# Patient Record
Sex: Male | Born: 1955 | Race: White | Hispanic: No | Marital: Married | Smoking: Never smoker
Health system: Southern US, Community
[De-identification: ages and names within clinical notes are randomized; demographics above are authoritative.]

## PROBLEM LIST (undated history)

## (undated) DIAGNOSIS — M1990 Unspecified osteoarthritis, unspecified site: Secondary | ICD-10-CM

## (undated) DIAGNOSIS — E7800 Pure hypercholesterolemia, unspecified: Secondary | ICD-10-CM

## (undated) DIAGNOSIS — N529 Male erectile dysfunction, unspecified: Secondary | ICD-10-CM

## (undated) DIAGNOSIS — I10 Essential (primary) hypertension: Secondary | ICD-10-CM

## (undated) DIAGNOSIS — G479 Sleep disorder, unspecified: Secondary | ICD-10-CM

## (undated) DIAGNOSIS — L409 Psoriasis, unspecified: Secondary | ICD-10-CM

## (undated) DIAGNOSIS — D869 Sarcoidosis, unspecified: Secondary | ICD-10-CM

## (undated) DIAGNOSIS — G4730 Sleep apnea, unspecified: Secondary | ICD-10-CM

## (undated) DIAGNOSIS — M109 Gout, unspecified: Secondary | ICD-10-CM

## (undated) HISTORY — PX: COLONOSCOPY: SHX5424

## (undated) HISTORY — PX: EYE SURGERY: SHX253

## (undated) HISTORY — PX: TONSILLECTOMY: SUR1361

---

## 2008-01-09 ENCOUNTER — Ambulatory Visit: Payer: Self-pay | Admitting: Gastroenterology

## 2008-01-28 ENCOUNTER — Ambulatory Visit: Payer: Self-pay | Admitting: Gastroenterology

## 2009-09-12 ENCOUNTER — Emergency Department: Payer: Self-pay | Admitting: Unknown Physician Specialty

## 2011-03-31 ENCOUNTER — Ambulatory Visit: Payer: Self-pay | Admitting: Family Medicine

## 2011-09-09 ENCOUNTER — Ambulatory Visit: Payer: Self-pay | Admitting: Gastroenterology

## 2011-09-13 LAB — PATHOLOGY REPORT

## 2012-03-22 ENCOUNTER — Observation Stay: Payer: Self-pay | Admitting: Family Medicine

## 2012-03-22 LAB — COMPREHENSIVE METABOLIC PANEL
Alkaline Phosphatase: 92 U/L (ref 50–136)
BUN: 15 mg/dL (ref 7–18)
Bilirubin,Total: 0.4 mg/dL (ref 0.2–1.0)
Calcium, Total: 9.1 mg/dL (ref 8.5–10.1)
Chloride: 107 mmol/L (ref 98–107)
Co2: 26 mmol/L (ref 21–32)
Creatinine: 0.97 mg/dL (ref 0.60–1.30)
EGFR (Non-African Amer.): >60
Glucose: 100 mg/dL — ABNORMAL HIGH (ref 65–99)
Osmolality: 280 (ref 275–301)
Potassium: 4.2 mmol/L (ref 3.5–5.1)
Sodium: 140 mmol/L (ref 136–145)
Total Protein: 7.9 g/dL (ref 6.4–8.2)

## 2012-03-22 LAB — CBC
HCT: 43.6 % (ref 40.0–52.0)
HGB: 13.9 g/dL (ref 13.0–18.0)
MCH: 26.6 pg (ref 26.0–34.0)
MCHC: 31.9 g/dL — ABNORMAL LOW (ref 32.0–36.0)
MCV: 84 fL (ref 80–100)
Platelet: 309 10*3/uL (ref 150–440)
RBC: 5.23 10*6/uL (ref 4.40–5.90)
RDW: 13.7 % (ref 11.5–14.5)
WBC: 11.1 10*3/uL — ABNORMAL HIGH (ref 3.8–10.6)

## 2012-03-22 LAB — URINALYSIS, COMPLETE
Bacteria: NONE SEEN
Bilirubin,UR: NEGATIVE
Blood: NEGATIVE
Leukocyte Esterase: NEGATIVE
Ph: 5 (ref 4.5–8.0)
Specific Gravity: 1.021 (ref 1.003–1.030)
Squamous Epithelial: NONE SEEN
WBC UR: <1 /HPF (ref 0–5)

## 2012-03-23 LAB — LIPID PANEL
HDL Cholesterol: 46 mg/dL (ref 40–60)
Triglycerides: 119 mg/dL (ref 0–200)
VLDL Cholesterol, Calc: 24 mg/dL (ref 5–40)

## 2013-09-04 ENCOUNTER — Ambulatory Visit: Payer: Self-pay | Admitting: Neurology

## 2013-09-18 ENCOUNTER — Ambulatory Visit: Payer: Self-pay | Admitting: Neurology

## 2013-11-10 ENCOUNTER — Ambulatory Visit: Payer: Self-pay | Admitting: Physician Assistant

## 2013-11-10 LAB — URINALYSIS, COMPLETE
Bacteria: NEGATIVE
Blood: NEGATIVE
Glucose,UR: NEGATIVE
Leukocyte Esterase: NEGATIVE
NITRITE: NEGATIVE
Ph: 5.5 (ref 5.0–8.0)
RBC,UR: NONE SEEN /HPF (ref 0–5)
Specific Gravity: 1.025 (ref 1.000–1.030)
Squamous Epithelial: NONE SEEN

## 2014-05-23 NOTE — Discharge Summary (Signed)
PATIENT NAME:  Bryce Jenkins, Bryce Jenkins MR#:  [id number] DATE OF BIRTH:  1955/04/05  DATE OF ADMISSION:  03/22/2012 DATE OF DISCHARGE:  03/23/2012  DISCHARGE DIAGNOSES: 1.  Bell palsy of the right side of the face.  2.  Hyperlipidemia.  It is diet-controlled.  3.  Allergic rhinitis.   DISCHARGE MEDICATIONS:   1.  Prednisone 10 mg tabs 6 tabs by mouth x 5 days, then decrease by 10 mg every two days.  2.  Valacyclovir 1 gram 1 tab by mouth 3 times daily.  3.  Flonase as directed.   PERTINENT LABORATORY AND STUDIES:  The patient had a CT scan that was negative.  The patient also had MRI of the brain that was negative.   PROCEDURES:  None.   CONSULTATIONS:  None.   BRIEF HOSPITAL COURSE:  1.  Bell palsy.  The patient initially came in with right side facial numbness and weakness.  On exam was noted to be consistent with Bell palsy.  A CT and MRI of the brain were both negative.  He is to be treated with seven days of valacyclovir 1 gram 3 times a day.  Also, be treated with a prednisone taper.  We will need to cover the eye to protect the eye if unable to blink and also use eye moisturizing drops as needed.  Will follow up with Dr. Netty Starring within 10 days.  2.  Other chronic medical issues remained stable.   He is to be discharged home.     ____________________________ Dion Body, MD kl:ea D: 03/23/2012 16:34:47 ET T: 03/24/2012 06:44:08 ET JOB#: [id number]  cc: Dion Body, MD, <Dictator> Dr. Burtis Junes MD ELECTRONICALLY SIGNED 03/27/2012 14:21

## 2014-05-23 NOTE — H&P (Signed)
PATIENT NAME:  Bryce Jenkins, ORCUTT MR#:  [id number] DATE OF BIRTH:  1955-11-23  DATE OF ADMISSION:  03/22/2012  PRIMARY CARE PHYSICIAN:  Dr. Netty Starring REFERRING PHYSICIAN:  Dr. Reita Cliche.  CHIEF COMPLAINT: Right facial numbness and tingling today.   HISTORY OF PRESENT ILLNESS: The patient is a 59 year old Caucasian male with a history of sarcoidosis diagnosis in 1998, who presented to the ED with right facial numbness, tingling, today. The patient also has right arm numbness and tingling, unable to close right eye and unable to move tongue. The patient denies any headache or dizziness. No slurred speech, or dysphagia or choking. The patient denies any incontinence, loss of consciousness or seizure. The patient said he got a cold last Saturday. He denies any other weakness.   PAST MEDICAL HISTORY: Sarcoidosis.   PAST SURGICAL HISTORY: None.   SOCIAL HISTORY: No smoking, alcohol drinking or illicit drugs.   FAMILY HISTORY: Mother had skull aneurysm. Father had 2 abdominal aneurysms, cousin had brain aneurysm, both parents had hypertension.   ALLERGIES: None.  MEDICATIONS:  Flonase 50 mcg inhalation 1 spray once a day.   REVIEW OF SYSTEMS:   CONSTITUTIONAL: The patient denies any fever or chills. No headache or dizziness. No weakness.   EYES: No double vision, blurry vision,  but unable to close right eye.  ENT: No postnasal drip, slurred speech or dysphagia. No epistaxis.   CARDIOVASCULAR: No chest pain, palpitation, orthopnea or nocturnal dyspnea. No leg edema.  PULMONARY: No cough, sputum, shortness of breath or hemoptysis.  GASTROINTESTINAL: No abdominal pain, nausea, vomiting, or diarrhea. No melena or bloody stool.  GENITOURINARY: No dysuria, hematuria or incontinence.  SKIN: No rash or jaundice.  NEUROLOGY: No syncope, loss of consciousness or seizure, but has right-sided facial droop and numbness, tingling,  ENDOCRINE: No polyuria, polydipsia or heat or cold intolerance.  HEMATOLOGY: No  easy bruising, bleeding.   PHYSICAL EXAMINATION: VITAL SIGNS: Temperature 98.9, blood pressure 155/81 initially 185/85 pulse 93, respirations 20, oxygen saturation 99% on room air.  GENERAL: The patient is alert, awake, oriented, in no acute distress.  HEENT: Pupils round, equal, reactive to light and accommodation. Moist oral mucosa. Clear oropharynx. Has right-sided facial droop, unable to close right eye.  NECK: Supple. No JVD or carotid bruits. No lymphadenopathy. No thyromegaly.  CARDIOVASCULAR: S1, S2 regular rate and rhythm. No murmurs, gallops.  PULMONARY: Bilateral air entry. No wheezing or rales. No use of accessory muscles to breathe.  ABDOMEN: Soft. No distention, no tenderness and no organomegaly. Bowel sounds present.  EXTREMITIES: No edema, clubbing or cyanosis. No calf tenderness. Strong bilateral pedal pulses.  SKIN: No rash or jaundice.  NEUROLOGY: A and O x 3. The patient has right facial droop, but no other focal deficit. Power 5/5. Sensation intact.   LABORATORY DATA: Glucose 100, BUN 15, creatinine 0.97. Electrolytes are normal. WBC 11.1, hemoglobin 13.9 and platelets 309.   EKG showed normal sinus rhythm at 96 beats per minute with possible left atrial enlargement with incomplete right bundle branch block.   IMPRESSION:   1.  Possible Bell's palsy, but need to rule out acute cerebrovascular accident.  2.  History of sarcoidosis.   PLAN OF TREATMENT: 1. The patient will be placed for observation. The patient has high possibility of Bell palsy. We will start valacyclovir and prednisone, but the patient needs to rule out acute cerebrovascular accident and also need to rule out brain aneurysm due to family history of  aneurysm in both parents and cousin.  CAT scan of head is negative. We will get an MRI of the brain, check lipid panel, we will start aspirin  and Zocor.  2.  Gastrointestinal and deep vein thrombosis prophylaxis.  3.  I discussed the patient and situation  and plan of treatment with the patient.   TIME SPENT: About 55 minutes    ____________________________ Demetrios Loll, MD qc:cc D: 03/22/2012 21:21:00 ET T: 03/22/2012 21:56:48 ET JOB#: [id number]  cc: Demetrios Loll, MD, <Dictator> Demetrios Loll MD ELECTRONICALLY SIGNED 03/23/2012 14:36

## 2015-09-24 ENCOUNTER — Ambulatory Visit: Payer: BLUE CROSS/BLUE SHIELD | Attending: Neurology

## 2015-09-24 DIAGNOSIS — G4733 Obstructive sleep apnea (adult) (pediatric): Secondary | ICD-10-CM | POA: Insufficient documentation

## 2015-09-24 DIAGNOSIS — R0683 Snoring: Secondary | ICD-10-CM | POA: Diagnosis present

## 2015-09-24 DIAGNOSIS — G4761 Periodic limb movement disorder: Secondary | ICD-10-CM | POA: Insufficient documentation

## 2016-11-22 ENCOUNTER — Ambulatory Visit
Admission: RE | Admit: 2016-11-22 | Discharge: 2016-11-22 | Disposition: A | Discharge status: Home / Self Care | Payer: BLUE CROSS/BLUE SHIELD | Source: Ambulatory Visit | Attending: Family Medicine | Admitting: Family Medicine

## 2016-11-22 ENCOUNTER — Other Ambulatory Visit: Payer: Self-pay | Admitting: Family Medicine

## 2016-11-22 DIAGNOSIS — M79661 Pain in right lower leg: Secondary | ICD-10-CM

## 2018-01-06 ENCOUNTER — Ambulatory Visit (INDEPENDENT_AMBULATORY_CARE_PROVIDER_SITE_OTHER): Payer: BLUE CROSS/BLUE SHIELD

## 2018-01-06 ENCOUNTER — Ambulatory Visit
Admission: EM | Admit: 2018-01-06 | Discharge: 2018-01-06 | Disposition: A | Discharge status: Home / Self Care | Payer: BLUE CROSS/BLUE SHIELD

## 2018-01-06 ENCOUNTER — Other Ambulatory Visit: Payer: Self-pay

## 2018-01-06 ENCOUNTER — Encounter: Payer: Self-pay | Admitting: Gynecology

## 2018-01-06 DIAGNOSIS — Z862 Personal history of diseases of the blood and blood-forming organs and certain disorders involving the immune mechanism: Secondary | ICD-10-CM

## 2018-01-06 DIAGNOSIS — R062 Wheezing: Secondary | ICD-10-CM | POA: Diagnosis not present

## 2018-01-06 DIAGNOSIS — Z8709 Personal history of other diseases of the respiratory system: Secondary | ICD-10-CM

## 2018-01-06 DIAGNOSIS — Z760 Encounter for issue of repeat prescription: Secondary | ICD-10-CM

## 2018-01-06 DIAGNOSIS — J101 Influenza due to other identified influenza virus with other respiratory manifestations: Secondary | ICD-10-CM

## 2018-01-06 HISTORY — DX: Sleep disorder, unspecified: G47.9

## 2018-01-06 HISTORY — DX: Psoriasis, unspecified: L40.9

## 2018-01-06 HISTORY — DX: Pure hypercholesterolemia, unspecified: E78.00

## 2018-01-06 HISTORY — DX: Gout, unspecified: M10.9

## 2018-01-06 HISTORY — DX: Essential (primary) hypertension: I10

## 2018-01-06 HISTORY — DX: Male erectile dysfunction, unspecified: N52.9

## 2018-01-06 LAB — RAPID INFLUENZA A&B ANTIGENS
Influenza A (ARMC): POSITIVE — AB
Influenza B (ARMC): NEGATIVE

## 2018-01-06 MED ORDER — OSELTAMIVIR PHOSPHATE 75 MG PO CAPS: 75.0000 mg | ORAL_CAPSULE | Freq: Two times a day (BID) | ORAL | 0 refills | Status: AC

## 2018-01-06 MED ORDER — PREDNISONE 10 MG PO TABS: ORAL_TABLET | ORAL | 0 refills | Status: AC

## 2018-01-06 MED ORDER — ALBUTEROL SULFATE HFA 108 (90 BASE) MCG/ACT IN AERS: 2.0000 | INHALATION_SPRAY | RESPIRATORY_TRACT | 0 refills | Status: AC | PRN

## 2018-01-06 MED ORDER — IPRATROPIUM-ALBUTEROL 0.5-2.5 (3) MG/3ML IN SOLN
3.0000 mL | Freq: Once | RESPIRATORY_TRACT | Status: AC
  Administered 2018-01-06: 3 mL via RESPIRATORY_TRACT

## 2018-01-06 MED ORDER — HYDROCOD POLST-CPM POLST ER 10-8 MG/5ML PO SUER: 5.0000 mL | Freq: Every evening | ORAL | 0 refills | Status: AC | PRN

## 2018-01-06 NOTE — Discharge Instructions (Signed)
Take medication as prescribed. Rest. Drink plenty of fluids. Over the counter tylenol.  Follow up with your primary care physician this week as needed. Return to Urgent care for new or worsening concerns.

## 2018-01-06 NOTE — ED Provider Notes (Addendum)
[facility] ____________________________________________  Time seen: Approximately 10:30 AM  I have reviewed the triage vital signs and the nursing notes.   HISTORY  Chief Complaint No chief complaint on file.   HPI Bryce Jenkins is a 62 y.o. male with past medical history of sarcoidosis, hypertension presenting for evaluation of 2 days of cough, congestion and drainage complaints.  States on Thursday evening he started having runny nose, nasal congestion and some cough.  States yesterday his symptoms increase with overall feeling worse, accompanying headaches, feeling tired and increased cough.  States yesterday evening and last night he started to intermittently hear himself wheeze with some chest tightness and shortness of breath.  States shortness of breath only present with wheezing.  States he is out of his albuterol inhaler and needs a refill.  No accompanying chest pain.  Denies known fevers.  States cough has disrupted sleep.  Mostly dry cough.  Has taken some over-the-counter congestion medication, but no over-the-counter medications taken today.  Is overall continued to eat and drink well.  Denies abdominal pain or GI changes.  Denies known direct sick contacts.  Denies renal insufficiency.  Patient reports he was diagnosed with sarcoidosis in the 1990s and had a last relapse in early 2000's, and states has since had no issues.  PCP: Richarda Overlie   Past Medical History:  Diagnosis Date  . ED (erectile dysfunction)   . Gout   . Hypercholesteremia   . Hypertension   . Psoriasis   . Sleep disorder     There are no active problems to display for this patient.   Past Surgical History:  Procedure Laterality Date  . COLONOSCOPY    . EYE SURGERY    . TONSILLECTOMY       No current facility-administered medications for this encounter.   Current Outpatient Medications:  .  albuterol (VENTOLIN HFA) 108 (90 Base) MCG/ACT inhaler, INHALE 2 PUFFS INTO LUNGS  EVERY 4 HOURS AS NEEDED FOR WHEEZING., Disp: , Rfl:  .  aspirin EC 81 MG tablet, Take by mouth., Disp: , Rfl:  .  Lifitegrast 5 % SOLN, Apply to eye., Disp: , Rfl:  .  losartan (COZAAR) 25 MG tablet, Take by mouth., Disp: , Rfl:  .  Omega 3-6-9 CAPS, Take by mouth., Disp: , Rfl:  .  sodium fluoride (PREVIDENT) 1.1 % GEL dental gel, , Disp: , Rfl:  .  triamcinolone cream (KENALOG) 0.1 %, , Disp: , Rfl:  .  albuterol (PROVENTIL HFA;VENTOLIN HFA) 108 (90 Base) MCG/ACT inhaler, Inhale 2 puffs into the lungs every 4 (four) hours as needed., Disp: 1 Inhaler, Rfl: 0 .  Calcium Carbonate-Vitamin D 600-400 MG-UNIT tablet, Take by mouth., Disp: , Rfl:  .  chlorpheniramine-HYDROcodone (TUSSIONEX PENNKINETIC ER) 10-8 MG/5ML SUER, Take 5 mLs by mouth at bedtime as needed for cough. do not drive or operate machinery while taking as can cause drowsiness., Disp: 50 mL, Rfl: 0 .  oseltamivir (TAMIFLU) 75 MG capsule, Take 1 capsule (75 mg total) by mouth every 12 (twelve) hours., Disp: 10 capsule, Rfl: 0 .  predniSONE (DELTASONE) 10 MG tablet, Start 60 mg po day one, then 50 mg po day two, taper by 10 mg daily until complete., Disp: 21 tablet, Rfl: 0  Allergies Allopurinol  Family History  Problem Relation Age of Onset  . Lung cancer Mother   . Breast cancer Mother   . Anuerysm Mother     Social History Social History   Tobacco Use  .  Smoking status: Never Smoker  . Smokeless tobacco: Never Used  Substance Use Topics  . Alcohol use: Yes  . Drug use: Never    Review of Systems Constitutional: Some body aches.  Denies known fevers. ENT: States occasional sore throat from coughing only.  Denies other sore throat. Cardiovascular: Denies chest pain. Respiratory: Positive shortness of breath. Gastrointestinal: No abdominal pain.   Musculoskeletal: Negative for back pain.  Denies atypical extremity swelling. Skin: Negative for rash. Neurological: Negative for headaches, focal weakness or  numbness.    ____________________________________________   PHYSICAL EXAM:  VITAL SIGNS: ED Triage Vitals  Enc Vitals Group     BP 01/06/18 0916 (!) 141/69     Pulse Rate 01/06/18 0914 (!) 108     Resp 01/06/18 0914 18     Temp 01/06/18 0914 99 F (37.2 C)     Temp Source 01/06/18 0914 Oral     SpO2 01/06/18 0914 98 %     Weight 01/06/18 0917 268 lb (121.6 kg)     Height 01/06/18 0917 6\' 3"  (1.905 m)     Head Circumference --      Peak Flow --      Pain Score 01/06/18 0916 2     Pain Loc --      Pain Edu? --      Excl. in [place]? --     Constitutional: Alert and oriented. Well appearing and in no acute distress. Eyes: Conjunctivae are normal.  Head: Atraumatic. No sinus tenderness to palpation. No swelling. No erythema.  Ears: no erythema, normal TMs bilaterally.   Nose:Nasal congestion with clear rhinorrhea  Mouth/Throat: Mucous membranes are moist. No pharyngeal erythema. No tonsillar swelling or exudate.  Neck: No stridor.  No cervical spine tenderness to palpation. Hematological/Lymphatic/Immunilogical: No cervical lymphadenopathy. Cardiovascular:  regular rhythm. Grossly normal heart sounds.  Good peripheral circulation. Respiratory: Normal respiratory effort.  No retractions.  Mild scattered inspiratory and expiratory wheezes.  No rhonchi.  Occasional dry cough noted with bronchospasm.  Speaks in complete sentences.  Good air movement.  Musculoskeletal: Ambulatory with steady gait. No cervical, thoracic or lumbar tenderness to palpation. Neurologic:  Normal speech and language. No gait instability. Skin:  Skin appears warm, dry and intact. No rash noted. Psychiatric: Mood and affect are normal. Speech and behavior are normal.  ___________________________________________   LABS (all labs ordered are listed, but only abnormal results are displayed)  Labs Reviewed  RAPID INFLUENZA A&B ANTIGENS ([facility] ONLY) - Abnormal; Notable for the following components:      Result  Value   Influenza A ([facility]) POSITIVE (*)    All other components within normal limits   ____________________________________________  RADIOLOGY  Dg Chest 2 View  Result Date: 01/06/2018 CLINICAL DATA:  Cough over the last 3 days. EXAM: CHEST - 2 VIEW COMPARISON:  None. FINDINGS: Heart and mediastinal shadows are normal. There is mild central bronchial thickening but no infiltrate, collapse or effusion. No significant bone finding. Vascularity is normal. IMPRESSION: Bronchitis pattern. No consolidation or collapse. Electronically Signed   By: Nelson Chimes M.D.   On: 01/06/2018 10:14   ____________________________________________   PROCEDURES Procedures    INITIAL IMPRESSION / ASSESSMENT AND PLAN / ED COURSE  Pertinent labs & imaging results that were available during my care of the patient were reviewed by me and considered in my medical decision making (see chart for details).  Overall well-appearing patient.  No acute distress.  Patient with quick onset of symptoms.  Suspect influenza.  As with current wheezing and history of sarcoidosis will evaluate chest x-ray.  Chest x-ray as above per radiologist, bronchitis pattern, no consolidation or collapse.  Influenza A positive.  Discussed treatment with patient.  Will treat with prednisone taper, refill albuterol inhaler, Tamiflu and PRN Tussionex.  Encourage rest, fluids, supportive care.  Discussed very strict follow-up and return parameters.Discussed indication, risks and benefits of medications with patient.  Discussed follow up with Primary care physician this week. Discussed follow up and return parameters including no resolution or any worsening concerns. Patient verbalized understanding and agreed to plan.   ____________________________________________   FINAL CLINICAL IMPRESSION(S) / ED DIAGNOSES  Final diagnoses:  Influenza A  Wheezing  History of sarcoidosis     ED Discharge Orders         Ordered    oseltamivir  (TAMIFLU) 75 MG capsule  Every 12 hours     01/06/18 1032    predniSONE (DELTASONE) 10 MG tablet     01/06/18 1032    albuterol (PROVENTIL HFA;VENTOLIN HFA) 108 (90 Base) MCG/ACT inhaler  Every 4 hours PRN     01/06/18 1032    chlorpheniramine-HYDROcodone (TUSSIONEX PENNKINETIC ER) 10-8 MG/5ML SUER  At bedtime PRN     01/06/18 1032           Note: This dictation was prepared with Dragon dictation along with smaller phrase technology. Any transcriptional errors that result from this process are unintentional.        Marylene Land, NP 01/06/18 1226

## 2018-01-06 NOTE — ED Triage Notes (Addendum)
Patient c/o Patient c/o cold virus/ painful  In chest when coughing/ chest congestion and headache.

## 2019-06-05 IMAGING — CR DG CHEST 2V
2 series · 2 of 2 positions shown · non-contrast
Comparison: None.

CLINICAL DATA: Cough over the last 3 days.

EXAM:
CHEST - 2 VIEW

[chest pa]
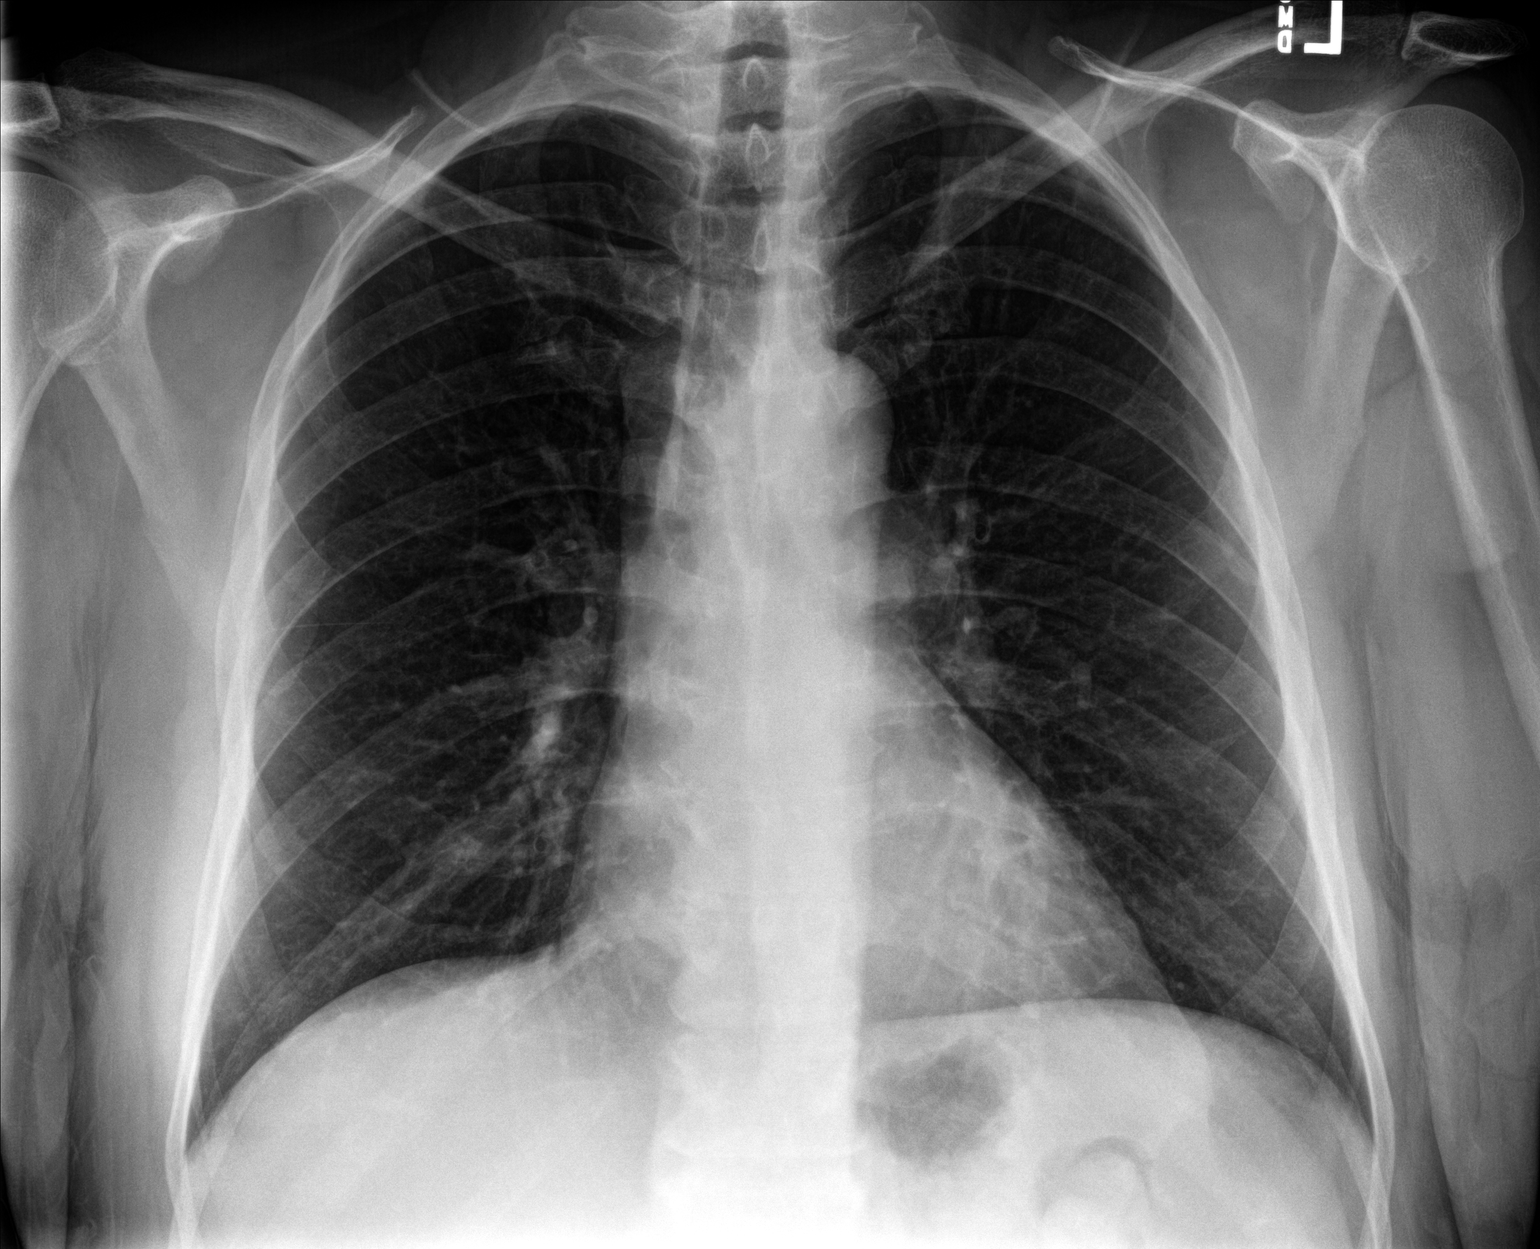

[chest lat]
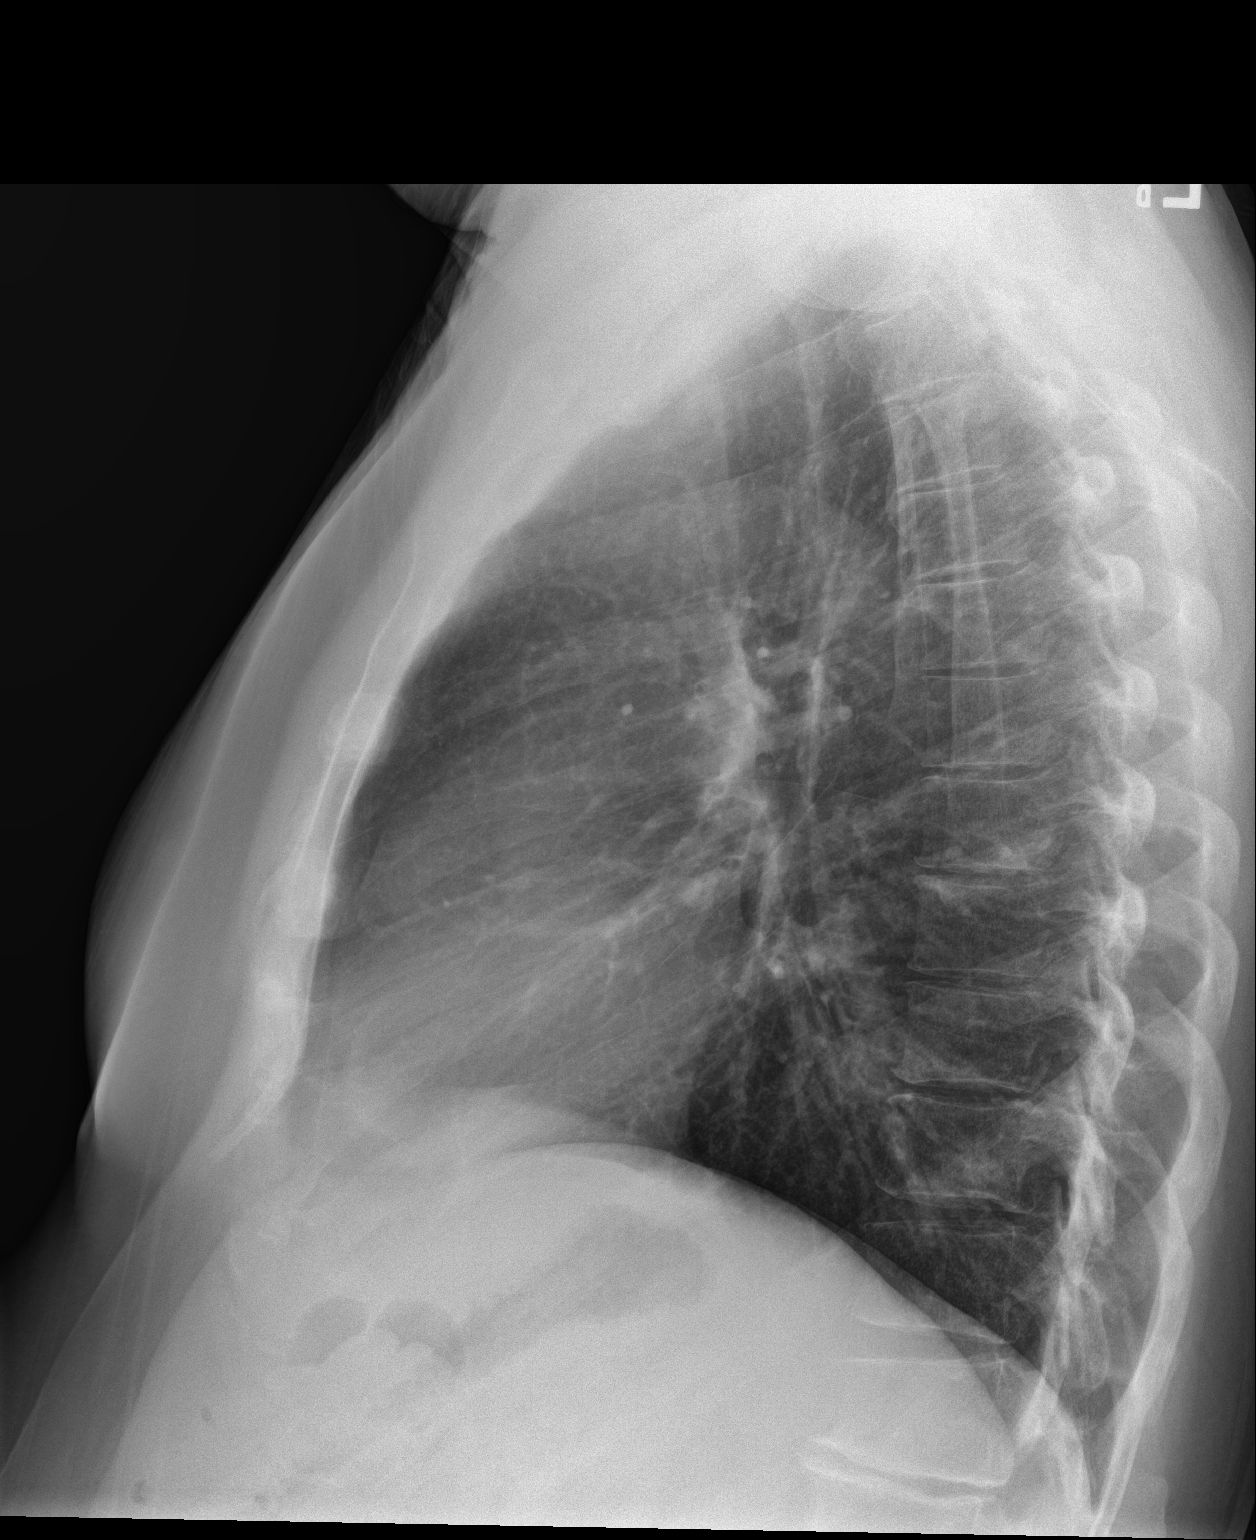

[2 of 2 positions shown; findings below may reference images not displayed]

FINDINGS: Heart and mediastinal shadows are normal. There is mild central
bronchial thickening but no infiltrate, collapse or effusion. No
significant bone finding. Vascularity is normal.
IMPRESSION: Bronchitis pattern. No consolidation or collapse.

## 2019-06-06 ENCOUNTER — Ambulatory Visit: Payer: BLUE CROSS/BLUE SHIELD | Attending: Internal Medicine

## 2019-06-06 DIAGNOSIS — Z23 Encounter for immunization: Secondary | ICD-10-CM

## 2019-06-06 NOTE — Progress Notes (Signed)
   Covid-19 Vaccination Clinic  Name:  Gaelen Rayas    MRN: [id number] DOB: Mar 04, 1955  06/06/2019  Mr. Apa was observed post Covid-19 immunization for 15 minutes without incident. He was provided with Vaccine Information Sheet and instruction to access the V-Safe system.   Mr. Kribs was instructed to call 911 with any severe reactions post vaccine: Marland Kitchen Difficulty breathing  . Swelling of face and throat  . A fast heartbeat  . A bad rash all over body  . Dizziness and weakness   Immunizations Administered    Name Date Dose VIS Date Route   Moderna COVID-19 Vaccine 06/06/2019  5:58 PM 0.5 mL 01/2019 Intramuscular   Manufacturer: Levan Hurst   Lot: [id number]   NDC: [id number]

## 2019-07-04 ENCOUNTER — Ambulatory Visit: Payer: BLUE CROSS/BLUE SHIELD

## 2019-07-11 ENCOUNTER — Other Ambulatory Visit: Payer: Self-pay

## 2019-07-11 ENCOUNTER — Other Ambulatory Visit
Admission: RE | Admit: 2019-07-11 | Discharge: 2019-07-11 | Disposition: A | Discharge status: Home / Self Care | Payer: BC Managed Care – PPO | Source: Ambulatory Visit | Attending: Internal Medicine | Admitting: Internal Medicine

## 2019-07-11 DIAGNOSIS — Z01812 Encounter for preprocedural laboratory examination: Secondary | ICD-10-CM | POA: Diagnosis not present

## 2019-07-11 DIAGNOSIS — Z20822 Contact with and (suspected) exposure to covid-19: Secondary | ICD-10-CM | POA: Diagnosis not present

## 2019-07-12 ENCOUNTER — Encounter: Payer: Self-pay | Admitting: Internal Medicine

## 2019-07-12 LAB — SARS CORONAVIRUS 2 (TAT 6-24 HRS): SARS Coronavirus 2: NEGATIVE

## 2019-07-15 ENCOUNTER — Ambulatory Visit: Payer: BC Managed Care – PPO | Admitting: Anesthesiology

## 2019-07-15 ENCOUNTER — Ambulatory Visit
Admission: RE | Admit: 2019-07-15 | Discharge: 2019-07-15 | Disposition: A | Discharge status: Home / Self Care | Payer: BC Managed Care – PPO | Attending: Internal Medicine | Admitting: Internal Medicine

## 2019-07-15 ENCOUNTER — Encounter: Payer: Self-pay | Admitting: Internal Medicine

## 2019-07-15 ENCOUNTER — Other Ambulatory Visit: Payer: Self-pay

## 2019-07-15 ENCOUNTER — Encounter
Admission: RE | Disposition: A | Discharge status: Home / Self Care | Payer: Self-pay | Source: Home / Self Care | Attending: Internal Medicine

## 2019-07-15 DIAGNOSIS — Z7952 Long term (current) use of systemic steroids: Secondary | ICD-10-CM | POA: Insufficient documentation

## 2019-07-15 DIAGNOSIS — D869 Sarcoidosis, unspecified: Secondary | ICD-10-CM | POA: Diagnosis not present

## 2019-07-15 DIAGNOSIS — I10 Essential (primary) hypertension: Secondary | ICD-10-CM | POA: Diagnosis not present

## 2019-07-15 DIAGNOSIS — Z7982 Long term (current) use of aspirin: Secondary | ICD-10-CM | POA: Diagnosis not present

## 2019-07-15 DIAGNOSIS — L409 Psoriasis, unspecified: Secondary | ICD-10-CM | POA: Diagnosis not present

## 2019-07-15 DIAGNOSIS — Z888 Allergy status to other drugs, medicaments and biological substances status: Secondary | ICD-10-CM | POA: Diagnosis not present

## 2019-07-15 DIAGNOSIS — M1990 Unspecified osteoarthritis, unspecified site: Secondary | ICD-10-CM | POA: Diagnosis not present

## 2019-07-15 DIAGNOSIS — G4730 Sleep apnea, unspecified: Secondary | ICD-10-CM | POA: Diagnosis not present

## 2019-07-15 DIAGNOSIS — Z1211 Encounter for screening for malignant neoplasm of colon: Secondary | ICD-10-CM | POA: Diagnosis present

## 2019-07-15 DIAGNOSIS — Z86010 Personal history of colonic polyps: Secondary | ICD-10-CM | POA: Insufficient documentation

## 2019-07-15 DIAGNOSIS — Z79899 Other long term (current) drug therapy: Secondary | ICD-10-CM | POA: Insufficient documentation

## 2019-07-15 DIAGNOSIS — E7800 Pure hypercholesterolemia, unspecified: Secondary | ICD-10-CM | POA: Diagnosis not present

## 2019-07-15 DIAGNOSIS — K5730 Diverticulosis of large intestine without perforation or abscess without bleeding: Secondary | ICD-10-CM | POA: Insufficient documentation

## 2019-07-15 DIAGNOSIS — D120 Benign neoplasm of cecum: Secondary | ICD-10-CM | POA: Diagnosis not present

## 2019-07-15 HISTORY — DX: Pure hypercholesterolemia, unspecified: E78.00

## 2019-07-15 HISTORY — DX: Sarcoidosis, unspecified: D86.9

## 2019-07-15 HISTORY — DX: Unspecified osteoarthritis, unspecified site: M19.90

## 2019-07-15 HISTORY — DX: Sleep apnea, unspecified: G47.30

## 2019-07-15 HISTORY — PX: COLONOSCOPY WITH PROPOFOL: SHX5780

## 2019-07-15 SURGERY — COLONOSCOPY WITH PROPOFOL
Anesthesia: General

## 2019-07-15 MED ORDER — PROPOFOL 500 MG/50ML IV EMUL
INTRAVENOUS | Status: AC
  Filled 2019-07-15: qty 50

## 2019-07-15 MED ORDER — PROPOFOL 500 MG/50ML IV EMUL
INTRAVENOUS | Status: DC | PRN
  Administered 2019-07-15: 150 ug/kg/min via INTRAVENOUS

## 2019-07-15 MED ORDER — LIDOCAINE HCL (PF) 2 % IJ SOLN
INTRAMUSCULAR | Status: DC | PRN
  Administered 2019-07-15: 60 mg via INTRADERMAL

## 2019-07-15 MED ORDER — PROPOFOL 10 MG/ML IV BOLUS
INTRAVENOUS | Status: DC | PRN
  Administered 2019-07-15: 40 mg via INTRAVENOUS
  Administered 2019-07-15: 60 mg via INTRAVENOUS

## 2019-07-15 MED ORDER — SODIUM CHLORIDE 0.9 % IV SOLN: INTRAVENOUS | Status: DC

## 2019-07-15 NOTE — H&P (Signed)
Outpatient short stay form Pre-procedure 07/15/2019 11:53 AM Bryce Jenkins K. Bryce Jenkins, M.D.  Primary Physician: Dion Body, M.D.  Reason for visit: Personal hx of colon polyps - 2009. Repeat colonoscopy in 2016 was unremarkable for polyps.  History of present illness:                           Patient presents for colonoscopy for a personal hx of colon polyps. The patient denies abdominal pain, abnormal weight loss or rectal bleeding.      Current Facility-Administered Medications:  .  0.9 %  sodium chloride infusion, , Intravenous, Continuous, [place], [name], MD, Last Rate: 20 mL/hr at 07/15/19 1118, New Bag at 07/15/19 1118  Medications Prior to Admission  Medication Sig Dispense Refill Last Dose  . albuterol (PROVENTIL HFA;VENTOLIN HFA) 108 (90 Base) MCG/ACT inhaler Inhale 2 puffs into the lungs every 4 (four) hours as needed. 1 Inhaler 0 Past Month at Unknown time  . albuterol (VENTOLIN HFA) 108 (90 Base) MCG/ACT inhaler INHALE 2 PUFFS INTO LUNGS EVERY 4 HOURS AS NEEDED FOR WHEEZING.   Past Month at Unknown time  . Calcium Carbonate-Vitamin D 600-400 MG-UNIT tablet Take by mouth.   Past Week at Unknown time  . losartan (COZAAR) 25 MG tablet Take by mouth.   07/14/2019 at Unknown time  . Omega 3-6-9 CAPS Take by mouth.   Past Week at Unknown time  . sodium fluoride (PREVIDENT) 1.1 % GEL dental gel    Past Week at Unknown time  . aspirin EC 81 MG tablet Take by mouth.   07/13/2019  . chlorpheniramine-HYDROcodone (TUSSIONEX PENNKINETIC ER) 10-8 MG/5ML SUER Take 5 mLs by mouth at bedtime as needed for cough. do not drive or operate machinery while taking as can cause drowsiness. (Patient not taking: Reported on 07/15/2019) 50 mL 0 Not Taking at Unknown time  . Lifitegrast 5 % SOLN Apply to eye.     Marland Kitchen oseltamivir (TAMIFLU) 75 MG capsule Take 1 capsule (75 mg total) by mouth every 12 (twelve) hours. (Patient not taking: Reported on 07/15/2019) 10 capsule 0 Not Taking at Unknown time  .  predniSONE (DELTASONE) 10 MG tablet Start 60 mg po day one, then 50 mg po day two, taper by 10 mg daily until complete. (Patient not taking: Reported on 07/15/2019) 21 tablet 0 Not Taking at Unknown time  . triamcinolone cream (KENALOG) 0.1 %  (Patient not taking: Reported on 07/15/2019)   Not Taking at Unknown time     Allergies  Allergen Reactions  . Allopurinol Tinitus     Past Medical History:  Diagnosis Date  . Arthritis   . ED (erectile dysfunction)   . Gout   . Hypercholesteremia   . Hypercholesterolemia   . Hypertension   . Psoriasis   . Sarcoidosis   . Sleep apnea   . Sleep disorder     Review of systems:  Otherwise negative.    Physical Exam  Gen: Alert, oriented. Appears stated age.  HEENT: [name]/AT. PERRLA. Lungs: CTA, no wheezes. CV: RR nl S1, S2. Abd: soft, benign, no masses. BS+ Ext: No edema. Pulses 2+    Planned procedures: Proceed with colonoscopy. The patient understands the nature of the planned procedure, indications, risks, alternatives and potential complications including but not limited to bleeding, infection, perforation, damage to internal organs and possible oversedation/side effects from anesthesia. The patient agrees and gives consent to proceed.  Please refer to procedure notes for findings, recommendations  and patient disposition/instructions.     Barrie Sigmund K. Bryce Jenkins, M.D. Gastroenterology 07/15/2019  11:53 AM

## 2019-07-15 NOTE — Transfer of Care (Signed)
Immediate Anesthesia Transfer of Care Note  Patient: Bryce Jenkins [facility]  Procedure(s) Performed: COLONOSCOPY WITH PROPOFOL (N/A )  Patient Location: PACU and Endoscopy Unit  Anesthesia Type:General  Level of Consciousness: drowsy  Airway & Oxygen Therapy: Patient Spontanous Breathing  Post-op Assessment: Report given to RN and Post -op Vital signs reviewed and stable  Post vital signs: Reviewed and stable  Last Vitals:  Vitals Value Taken Time  BP    Temp    Pulse    Resp    SpO2      Last Pain:  Vitals:   07/15/19 1054  TempSrc: Temporal  PainSc: 0-No pain         Complications: No complications documented.

## 2019-07-15 NOTE — Op Note (Signed)
[facility] Patient Name: Bryce Jenkins Procedure Date: 07/15/2019 11:51 AM MRN: [id number] Account #: [id number] Date of Birth: [birth year] Admit Type: Outpatient Age: 64 Room: [facility] ENDO ROOM 3 Gender: Male Note Status: Finalized Procedure:             Colonoscopy Indications:           Surveillance: Personal history of adenomatous polyps                         on last colonoscopy > 5 years ago Providers:             Lorie Apley K. Alice Reichert MD, MD Referring MD:          Dion Body (Referring MD) Medicines:             Propofol per Anesthesia Complications:         No immediate complications. Procedure:             Pre-Anesthesia Assessment:                        - The risks and benefits of the procedure and the                         sedation options and risks were discussed with the                         patient. All questions were answered and informed                         consent was obtained.                        - Patient identification and proposed procedure were                         verified prior to the procedure by the nurse. The                         procedure was verified in the procedure room.                        - ASA Grade Assessment: III - A patient with severe                         systemic disease.                        - After reviewing the risks and benefits, the patient                         was deemed in satisfactory condition to undergo the                         procedure.                        After obtaining informed consent, the colonoscope was                         passed under direct vision. Throughout  the procedure,                         the patient's blood pressure, pulse, and oxygen                         saturations were monitored continuously. The                         Colonoscope was introduced through the anus and                         advanced to the the cecum, identified by  appendiceal                         orifice and ileocecal valve. The colonoscopy was                         performed without difficulty. The patient tolerated                         the procedure well. The quality of the bowel                         preparation was adequate. The ileocecal valve,                         appendiceal orifice, and rectum were photographed. Findings:      The perianal and digital rectal examinations were normal. Pertinent       negatives include normal sphincter tone and no palpable rectal lesions.      A 4 mm polyp was found in the cecum. The polyp was sessile. The polyp       was removed with a cold biopsy forceps. Resection and retrieval were       complete.      Multiple small and large-mouthed diverticula were found in the sigmoid       colon.      The exam was otherwise without abnormality. Impression:            - One 4 mm polyp in the cecum, removed with a cold                         biopsy forceps. Resected and retrieved.                        - Diverticulosis in the sigmoid colon.                        - The examination was otherwise normal. Recommendation:        - Patient has a contact number available for                         emergencies. The signs and symptoms of potential                         delayed complications were discussed with the patient.  Return to normal activities tomorrow. Written                         discharge instructions were provided to the patient.                        - Resume previous diet.                        - Continue present medications.                        - Await pathology results.                        - Repeat colonoscopy in 5 years for surveillance.                        - Return to GI office PRN.                        - The findings and recommendations were discussed with                         the patient. Procedure Code(s):     --- Professional ---                         [phone], Colonoscopy, flexible; with biopsy, single or                         multiple Diagnosis Code(s):     --- Professional ---                        K57.30, Diverticulosis of large intestine without                         perforation or abscess without bleeding                        K63.5, Polyp of colon                        Z86.010, Personal history of colonic polyps CPT copyright 2019 [employer]. All rights reserved. The codes documented in this report are preliminary and upon coder review may  be revised to meet current compliance requirements. Efrain Sella MD, MD 07/15/2019 12:25:05 PM This report has been signed electronically. Number of Addenda: 0 Note Initiated On: 07/15/2019 11:51 AM Scope Withdrawal Time: 0 hours 6 minutes 35 seconds  Total Procedure Duration: 0 hours 10 minutes 0 seconds  Estimated Blood Loss:  Estimated blood loss: none.      [facility]

## 2019-07-15 NOTE — Interval H&P Note (Signed)
History and Physical Interval Note:  07/15/2019 11:53 AM  Bryce Jenkins  has presented today for surgery, with the diagnosis of P HX COLON POLYPS.  The various methods of treatment have been discussed with the patient and family. After consideration of risks, benefits and other options for treatment, the patient has consented to  Procedure(s): COLONOSCOPY WITH PROPOFOL (N/A) as a surgical intervention.  The patient's history has been reviewed, patient examined, no change in status, stable for surgery.  I have reviewed the patient's chart and labs.  Questions were answered to the patient's satisfaction.     [place], [place]

## 2019-07-15 NOTE — Anesthesia Preprocedure Evaluation (Signed)
Anesthesia Evaluation  Patient identified by MRN, date of birth, ID band Patient awake    Reviewed: Allergy & Precautions, NPO status , Patient's Chart, lab work & pertinent test results  History of Anesthesia Complications Negative for: history of anesthetic complications  Airway Mallampati: II       Dental   Pulmonary sleep apnea , neg COPD, Not current smoker,  Sarcoidosis, loss of lung volume 13%          Cardiovascular hypertension, Pt. on medications (-) Past MI and (-) CHF (-) dysrhythmias (-) Valvular Problems/Murmurs     Neuro/Psych neg Seizures    GI/Hepatic Neg liver ROS, neg GERD  ,  Endo/Other  neg diabetes  Renal/GU negative Renal ROS     Musculoskeletal   Abdominal   Peds  Hematology   Anesthesia Other Findings   Reproductive/Obstetrics                             Anesthesia Physical Anesthesia Plan  ASA: III  Anesthesia Plan: General   Post-op Pain Management:    Induction: Intravenous  PONV Risk Score and Plan: 2 and Propofol infusion and TIVA  Airway Management Planned: Nasal Cannula  Additional Equipment:   Intra-op Plan:   Post-operative Plan:   Informed Consent: I have reviewed the patients History and Physical, chart, labs and discussed the procedure including the risks, benefits and alternatives for the proposed anesthesia with the patient or authorized representative who has indicated his/her understanding and acceptance.       Plan Discussed with:   Anesthesia Plan Comments:         Anesthesia Quick Evaluation

## 2019-07-15 NOTE — Anesthesia Postprocedure Evaluation (Signed)
Anesthesia Post Note  Patient: Bryce Jenkins  Procedure(s) Performed: COLONOSCOPY WITH PROPOFOL (N/A )  Patient location during evaluation: Endoscopy Anesthesia Type: General Level of consciousness: awake and alert Pain management: pain level controlled Vital Signs Assessment: post-procedure vital signs reviewed and stable Respiratory status: spontaneous breathing and respiratory function stable Cardiovascular status: stable Anesthetic complications: no   No complications documented.   Last Vitals:  Vitals:   07/15/19 1235 07/15/19 1245  BP: (!) 136/93 (!) 136/93  Pulse: 78 72  Resp: 18 18  Temp:    SpO2: 100% 100%    Last Pain:  Vitals:   07/15/19 1235  TempSrc:   PainSc: 0-No pain                 Victorious Cosio K

## 2019-07-16 ENCOUNTER — Encounter: Payer: Self-pay | Admitting: Internal Medicine

## 2019-07-16 LAB — SURGICAL PATHOLOGY

## 2019-09-10 ENCOUNTER — Other Ambulatory Visit: Payer: Self-pay

## 2019-09-10 ENCOUNTER — Ambulatory Visit (INDEPENDENT_AMBULATORY_CARE_PROVIDER_SITE_OTHER): Payer: BC Managed Care – PPO | Admitting: Dermatology

## 2019-09-10 DIAGNOSIS — L719 Rosacea, unspecified: Secondary | ICD-10-CM

## 2019-09-10 DIAGNOSIS — L820 Inflamed seborrheic keratosis: Secondary | ICD-10-CM | POA: Diagnosis not present

## 2019-09-10 DIAGNOSIS — L570 Actinic keratosis: Secondary | ICD-10-CM

## 2019-09-10 DIAGNOSIS — L219 Seborrheic dermatitis, unspecified: Secondary | ICD-10-CM | POA: Diagnosis not present

## 2019-09-10 NOTE — Patient Instructions (Addendum)
Cryotherapy Aftercare  . Wash gently with soap and water everyday.   . Apply Vaseline and Band-Aid daily until healed.   Seborrheic Keratosis  What causes seborrheic keratoses? Seborrheic keratoses are harmless, common skin growths that first appear during adult life.  As time goes by, more growths appear.  Some people may develop a large number of them.  Seborrheic keratoses appear on both covered and uncovered body parts.  They are not caused by sunlight.  The tendency to develop seborrheic keratoses can be inherited.  They vary in color from skin-colored to gray, brown, or even black.  They can be either smooth or have a rough, warty surface.   Seborrheic keratoses are superficial and look as if they were stuck on the skin.  Under the microscope this type of keratosis looks like layers upon layers of skin.  That is why at times the top layer may seem to fall off, but the rest of the growth remains and re-grows.    Treatment Seborrheic keratoses do not need to be treated, but can easily be removed in the office.  Seborrheic keratoses often cause symptoms when they rub on clothing or jewelry.  Lesions can be in the way of shaving.  If they become inflamed, they can cause itching, soreness, or burning.  Removal of a seborrheic keratosis can be accomplished by freezing, burning, or surgery. If any spot bleeds, scabs, or grows rapidly, please return to have it checked, as these can be an indication of a skin cancer.   Recommend daily broad spectrum sunscreen SPF 30+ to sun-exposed areas, reapply every 2 hours as needed. Call for new or changing lesions.  

## 2019-09-10 NOTE — Progress Notes (Signed)
Follow-Up Visit   Subjective  Bryce Jenkins is a 64 y.o. male who presents for the following: UBSE (Hx AKs), rough spots (scalp), and spot (right forearm x 4 mos, left temple x yrs, growing).  Also changing color.  He uses metrogel for rosacea and ketoconazole shampoo and fluocinonide solution for Seb derm/psoriasis of the scalp and needs rfs.   The following portions of the chart were reviewed this encounter and updated as appropriate:      Review of Systems:  No other skin or systemic complaints except as noted in HPI or Assessment and Plan.  Objective  Well appearing patient in no apparent distress; mood and affect are within normal limits.  All skin waist up examined.  Objective  Face: Mild erythema on nose and malar cheeks.  Objective  L temple x 1, R upper forearm x 1 (2): Erythematous keratotic or waxy stuck-on papule or plaque on left temple. Pink brown waxy macule on right forearm, 3.70mm.  Objective  L forehead x 1, R temple x 1, R upper forehead x 1, scalp x 7 (10): Erythematous thin papules/macules with gritty scale.   Objective  Scalp: Pink patches with greasy scale.    Assessment & Plan  Rosacea Face  Inflamed seborrheic keratosis (2) L temple x 1, R upper forearm x 1  Destruction of lesion - L temple x 1, R upper forearm x 1  Destruction method: cryotherapy   Informed consent: discussed and consent obtained   Lesion destroyed using liquid nitrogen: Yes   Region frozen until ice ball extended beyond lesion: Yes   Outcome: patient tolerated procedure well with no complications   Post-procedure details: wound care instructions given    AK (actinic keratosis) (10) L forehead x 1, R temple x 1, R upper forehead x 1, scalp x 7  Destruction of lesion - L forehead x 1, R temple x 1, R upper forehead x 1, scalp x 7  Destruction method: cryotherapy   Informed consent: discussed and consent obtained   Lesion destroyed using liquid nitrogen: Yes     Region frozen until ice ball extended beyond lesion: Yes   Outcome: patient tolerated procedure well with no complications   Post-procedure details: wound care instructions given    Seborrheic dermatitis Scalp  Continue ketoconazole 2% shampoo Apply to scalp and let sit several minutes before rinsing.  Continue fluocinonide solutuion Apply to scalp qd/bid prn flares. Avoid face, groin, underarms.  *Add note to pharmacy that 1 bottle of each will last patient several months. Patient will call back with preferred pharmacy.  Actinic Damage - diffuse scaly erythematous macules with underlying dyspigmentation - Recommend daily broad spectrum sunscreen SPF 30+ to sun-exposed areas, reapply every 2 hours as needed.  - Call for new or changing lesions.  Skin cancer screening performed today.  Hemangiomas - Red papules - Discussed benign nature - Observe - Call for any changes  Lentigines - Scattered tan macules - Discussed due to sun exposure - Benign, observe - Call for any changes  Melanocytic Nevi - Tan-brown and/or pink-flesh-colored symmetric macules and papules - Benign appearing on exam today - Observation - Call clinic for new or changing moles - Recommend daily use of broad spectrum spf 30+ sunscreen to sun-exposed areas.    Return in about 6 months (around 03/12/2020) for AKs.   Lindi Adie, CMA, am acting as scribe for Brendolyn Patty, MD .  Documentation: I have reviewed the above documentation for accuracy and completeness,  and I agree with the above.  Brendolyn Patty MD

## 2020-03-16 ENCOUNTER — Ambulatory Visit: Payer: BC Managed Care – PPO | Admitting: Dermatology

## 2020-07-17 ENCOUNTER — Ambulatory Visit
Admission: RE | Admit: 2020-07-17 | Discharge: 2020-07-17 | Disposition: A | Discharge status: Home / Self Care | Payer: BC Managed Care – PPO | Source: Ambulatory Visit | Attending: Emergency Medicine | Admitting: Emergency Medicine

## 2020-07-17 ENCOUNTER — Ambulatory Visit
Admission: RE | Admit: 2020-07-17 | Discharge: 2020-07-17 | Disposition: A | Discharge status: Home / Self Care | Payer: BC Managed Care – PPO | Attending: Emergency Medicine | Admitting: Emergency Medicine

## 2020-07-17 ENCOUNTER — Other Ambulatory Visit: Payer: Self-pay | Admitting: Emergency Medicine

## 2020-07-17 DIAGNOSIS — R0600 Dyspnea, unspecified: Secondary | ICD-10-CM | POA: Insufficient documentation

## 2020-07-23 ENCOUNTER — Other Ambulatory Visit: Payer: Self-pay | Admitting: Physician Assistant

## 2020-07-23 DIAGNOSIS — R0600 Dyspnea, unspecified: Secondary | ICD-10-CM

## 2020-08-04 ENCOUNTER — Ambulatory Visit: Payer: BC Managed Care – PPO

## 2020-08-12 ENCOUNTER — Ambulatory Visit: Admission: RE | Admit: 2020-08-12 | Payer: BC Managed Care – PPO | Source: Ambulatory Visit

## 2020-08-19 ENCOUNTER — Ambulatory Visit: Payer: BC Managed Care – PPO

## 2020-09-02 ENCOUNTER — Ambulatory Visit: Admission: RE | Admit: 2020-09-02 | Payer: BC Managed Care – PPO | Source: Ambulatory Visit

## 2020-10-30 ENCOUNTER — Other Ambulatory Visit: Payer: Self-pay | Admitting: Pulmonary Disease

## 2020-10-30 DIAGNOSIS — R7989 Other specified abnormal findings of blood chemistry: Secondary | ICD-10-CM

## 2020-11-11 ENCOUNTER — Other Ambulatory Visit: Payer: Self-pay

## 2020-11-11 ENCOUNTER — Ambulatory Visit
Admission: RE | Admit: 2020-11-11 | Discharge: 2020-11-11 | Disposition: A | Discharge status: Home / Self Care | Payer: BC Managed Care – PPO | Source: Ambulatory Visit | Attending: Pulmonary Disease | Admitting: Pulmonary Disease

## 2020-11-11 DIAGNOSIS — R7989 Other specified abnormal findings of blood chemistry: Secondary | ICD-10-CM | POA: Insufficient documentation

## 2020-11-11 MED ORDER — IOHEXOL 350 MG/ML SOLN
75.0000 mL | Freq: Once | INTRAVENOUS | Status: AC | PRN
  Administered 2020-11-11: 75 mL via INTRAVENOUS

## 2022-04-10 IMAGING — CT CT ANGIO CHEST
2 of 7 series · 15 of 36 positions shown · IV contrast (omnipaque)
Comparison: None.

CLINICAL DATA: Shortness of breath

EXAM:
CT ANGIOGRAPHY CHEST WITH CONTRAST
TECHNIQUE: Multidetector CT imaging of the chest was performed using the
standard protocol during bolus administration of intravenous
contrast. Multiplanar CT image reconstructions and MIPs were
obtained to evaluate the vascular anatomy.
CONTRAST:  75mL OMNIPAQUE IOHEXOL 350 MG/ML SOLN

[Series 5: thins cta pulmonary 1.00 · axial · 0.66mm/px · z∈[-1204,-926]mm · 14 of 402 slices shown]
[im 27/402  lung]
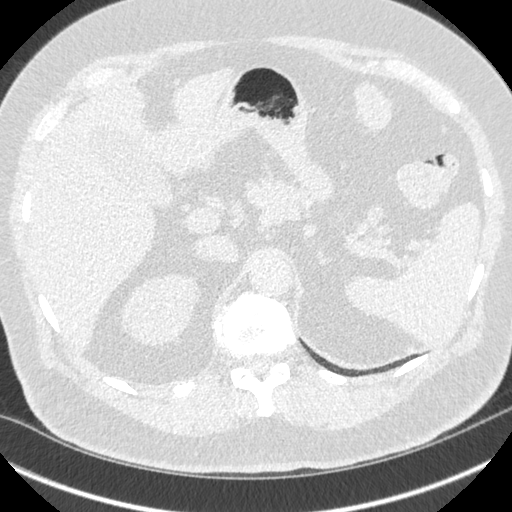
[im 54/402  mediastinal]
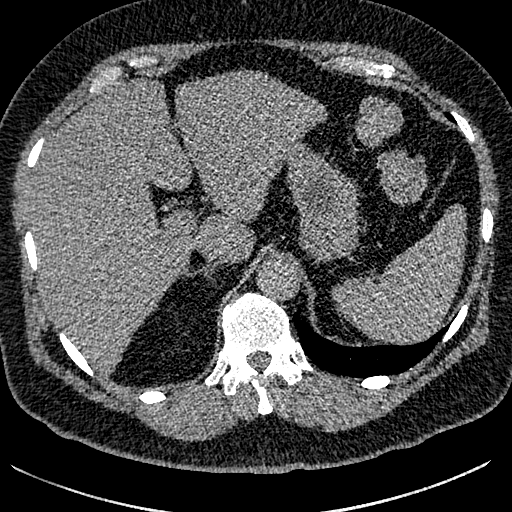
[im 81/402  lung]
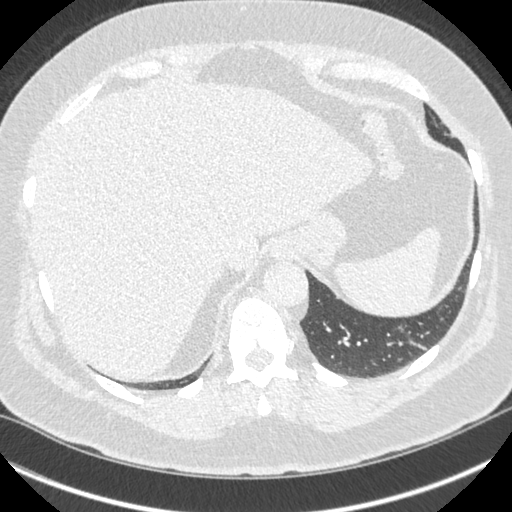
[im 107/402  mediastinal]
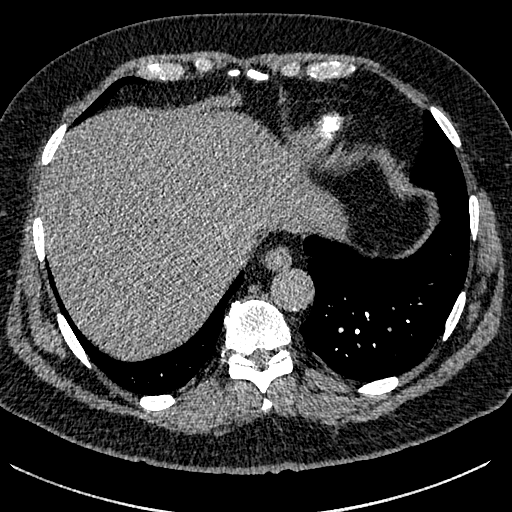
[im 134/402  lung]
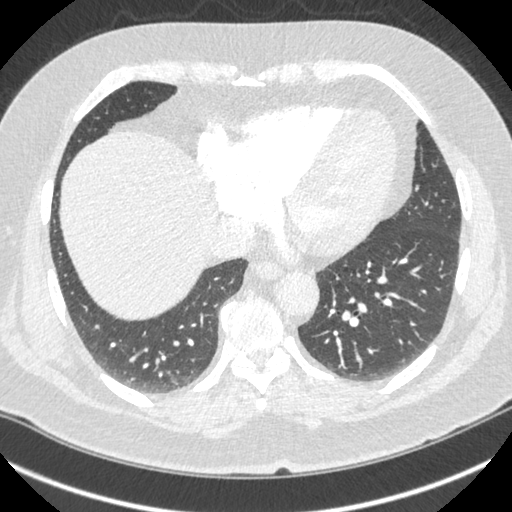
[im 161/402  mediastinal]
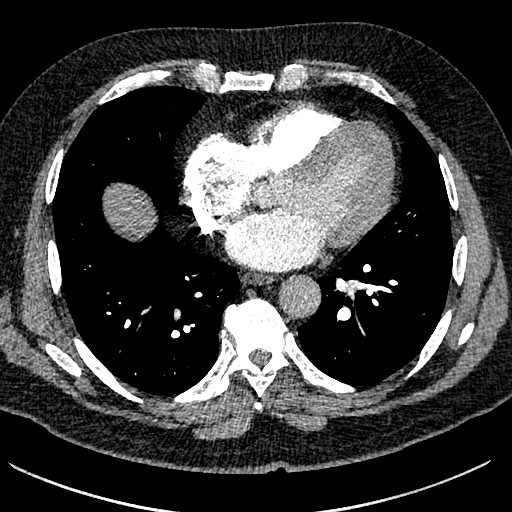
[im 188/402  lung]
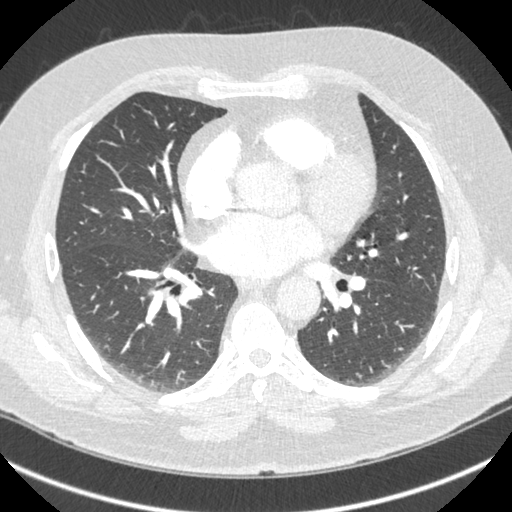
[im 214/402  mediastinal]
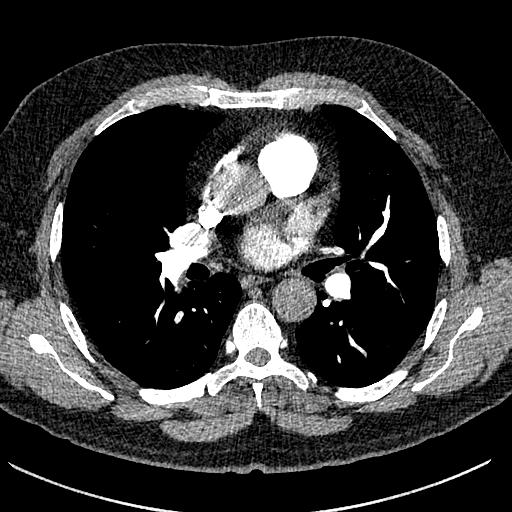
[im 241/402  lung]
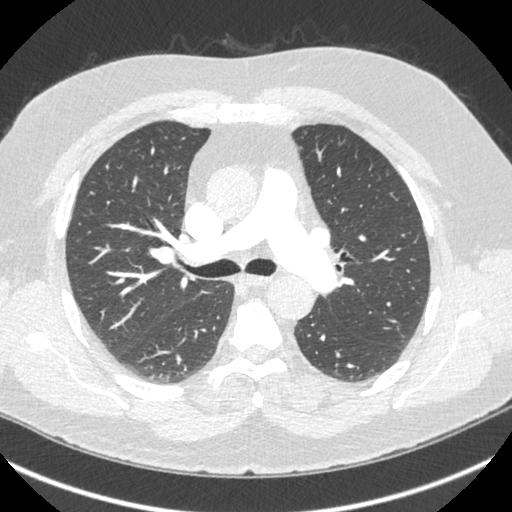
[im 268/402  mediastinal]
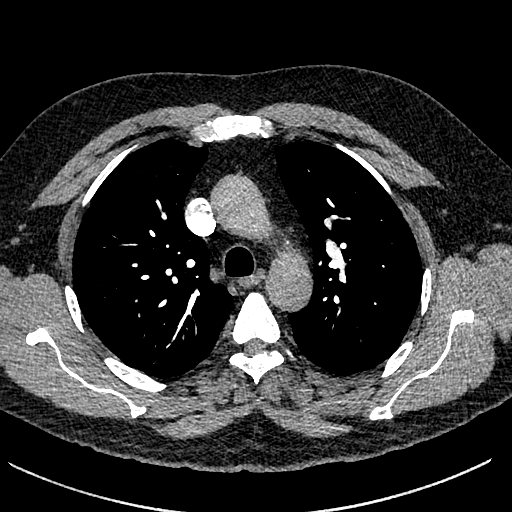
[im 295/402  lung]
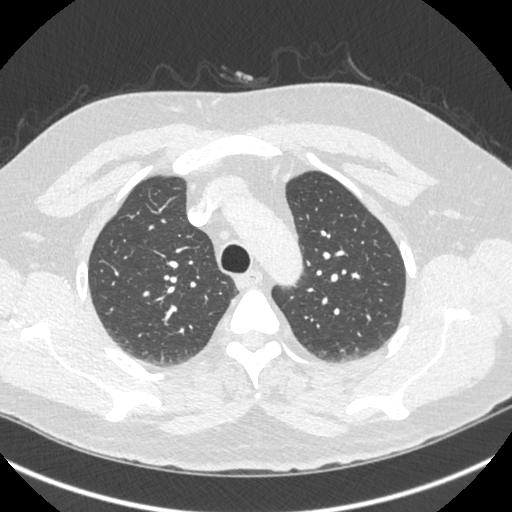
[im 321/402  mediastinal]
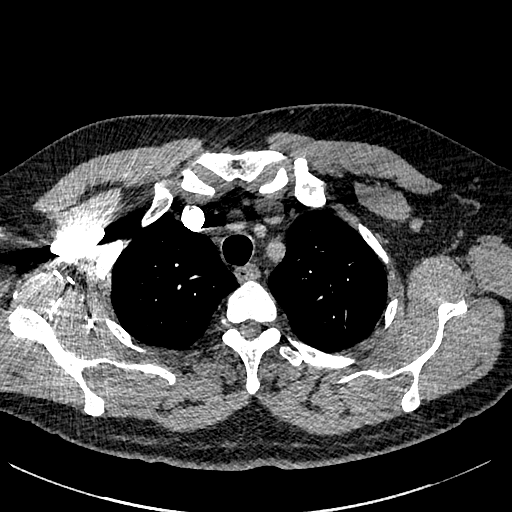
[im 348/402  lung]
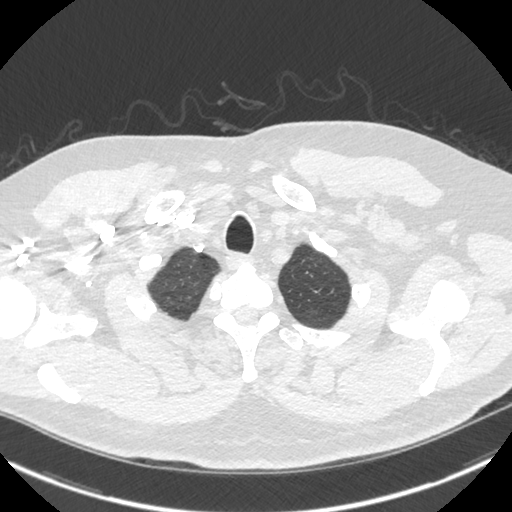
[im 375/402  mediastinal]
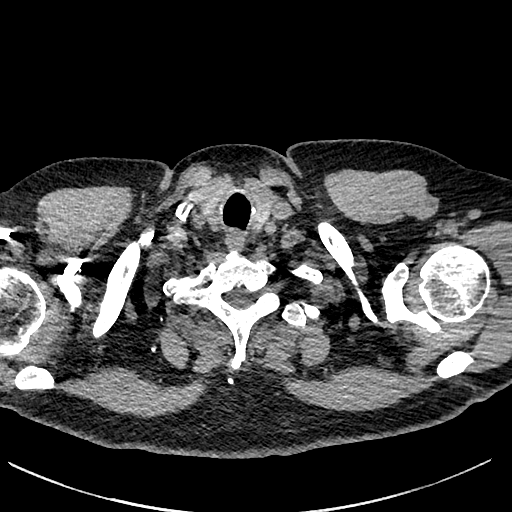

[Series 7: cta pulmonary 2.00 cor · coronal · 0.63mm/px · 1 of 147 slices shown]
[im 74/147  mediastinal]
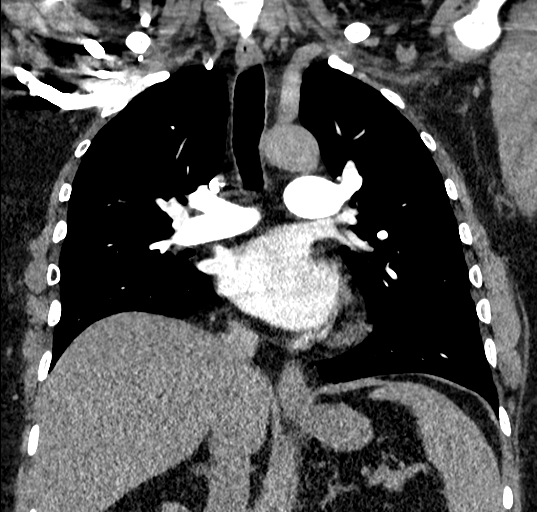

[15 of 36 positions shown; findings below may reference images not displayed]

FINDINGS: Cardiovascular: Good contrast opacification of the pulmonary
arteries with no filling defects. No pericardial effusion. No
coronary artery calcifications no significant atherosclerotic
disease of the thoracic aorta.

Mediastinum/Nodes: Esophagus and thyroid are unremarkable. No
enlarged lymph nodes seen in the chest.

Lungs/Pleura: Central airways are patent. Mild linear opacities of
the left lower lobe, likely due to scarring or atelectasis. No
consolidation, pleural effusion or pneumothorax. Solid pulmonary
nodule of the left lower lobe measuring 6 mm on series 6, image 120.

Upper Abdomen: No acute abnormality.

Musculoskeletal: No chest wall abnormality. No acute or significant
osseous findings.

Review of the MIP images confirms the above findings.
IMPRESSION: No pulmonary embolus.

No CT findings of pulmonary sarcoidosis.

Solid pulmonary nodule of the left lower lobe measuring 6 mm.
Non-contrast chest CT at 6-12 months is recommended. If the nodule
is stable at time of repeat CT, then future CT at 18-24 months (from
today's scan) is considered optional for low-risk patients, but is
recommended for high-risk patients. This recommendation follows the
consensus statement: Guidelines for Management of Incidental
Pulmonary Nodules Detected on CT Images: From the [HOSPITAL]

## 2022-06-14 ENCOUNTER — Other Ambulatory Visit: Payer: Self-pay | Admitting: Family Medicine

## 2022-06-14 DIAGNOSIS — E7800 Pure hypercholesterolemia, unspecified: Secondary | ICD-10-CM

## 2022-06-14 DIAGNOSIS — Z9189 Other specified personal risk factors, not elsewhere classified: Secondary | ICD-10-CM

## 2022-06-23 ENCOUNTER — Ambulatory Visit
Admission: RE | Admit: 2022-06-23 | Discharge: 2022-06-23 | Disposition: A | Discharge status: Home / Self Care | Payer: BC Managed Care – PPO | Source: Ambulatory Visit | Attending: Family Medicine | Admitting: Family Medicine

## 2022-06-23 DIAGNOSIS — Z9189 Other specified personal risk factors, not elsewhere classified: Secondary | ICD-10-CM | POA: Insufficient documentation

## 2022-06-23 DIAGNOSIS — E7800 Pure hypercholesterolemia, unspecified: Secondary | ICD-10-CM | POA: Insufficient documentation

## 2023-01-03 ENCOUNTER — Ambulatory Visit: Payer: BC Managed Care – PPO | Admitting: Dermatology

## 2023-01-03 ENCOUNTER — Encounter: Payer: Self-pay | Admitting: Dermatology

## 2023-01-03 DIAGNOSIS — L719 Rosacea, unspecified: Secondary | ICD-10-CM | POA: Diagnosis not present

## 2023-01-03 DIAGNOSIS — L570 Actinic keratosis: Secondary | ICD-10-CM | POA: Diagnosis not present

## 2023-01-03 DIAGNOSIS — L84 Corns and callosities: Secondary | ICD-10-CM

## 2023-01-03 DIAGNOSIS — D229 Melanocytic nevi, unspecified: Secondary | ICD-10-CM

## 2023-01-03 DIAGNOSIS — Z1283 Encounter for screening for malignant neoplasm of skin: Secondary | ICD-10-CM

## 2023-01-03 DIAGNOSIS — L578 Other skin changes due to chronic exposure to nonionizing radiation: Secondary | ICD-10-CM

## 2023-01-03 DIAGNOSIS — W908XXA Exposure to other nonionizing radiation, initial encounter: Secondary | ICD-10-CM | POA: Diagnosis not present

## 2023-01-03 DIAGNOSIS — L219 Seborrheic dermatitis, unspecified: Secondary | ICD-10-CM

## 2023-01-03 DIAGNOSIS — L814 Other melanin hyperpigmentation: Secondary | ICD-10-CM

## 2023-01-03 DIAGNOSIS — D1801 Hemangioma of skin and subcutaneous tissue: Secondary | ICD-10-CM

## 2023-01-03 DIAGNOSIS — Z872 Personal history of diseases of the skin and subcutaneous tissue: Secondary | ICD-10-CM

## 2023-01-03 DIAGNOSIS — L821 Other seborrheic keratosis: Secondary | ICD-10-CM

## 2023-01-03 NOTE — Progress Notes (Signed)
Follow-Up Visit   Subjective  Bryce Jenkins is a 67 y.o. male who presents for the following: Skin Cancer Screening and Upper Body Skin Exam. Hx of AKs. No personal Hx of skin cancer. Areas of concern on scalp and left hand.  Rosacea. Face. Uses Metronidazole gel. Doing well  Seborrheic dermatitis. Scalp. Uses Ketoconazole 2% shampoo and fluocinonide solution as directed PRN.   The patient presents for Upper Body Skin Exam (UBSE) for skin cancer screening and mole check. The patient has spots, moles and lesions to be evaluated, some may be new or changing and the patient may have concern these could be cancer.    The following portions of the chart were reviewed this encounter and updated as appropriate: medications, allergies, medical history  Review of Systems:  No other skin or systemic complaints except as noted in HPI or Assessment and Plan.  Objective  Well appearing patient in no apparent distress; mood and affect are within normal limits.  All skin waist up and feet examined. Relevant physical exam findings are noted in the Assessment and Plan.  central upper forehead x2, L temple x3, R upper forehead x1, R temple x2, Scalp x7, L upper forehead x1 (16) Erythematous thin papules/macules with gritty scale.     Assessment & Plan   AK (actinic keratosis) (16) central upper forehead x2, L temple x3, R upper forehead x1, R temple x2, Scalp x7, L upper forehead x1  Actinic keratoses are precancerous spots that appear secondary to cumulative UV radiation exposure/sun exposure over time. They are chronic with expected duration over 1 year. A portion of actinic keratoses will progress to squamous cell carcinoma of the skin. It is not possible to reliably predict which spots will progress to skin cancer and so treatment is recommended to prevent development of skin cancer.  Recommend daily broad spectrum sunscreen SPF 30+ to sun-exposed areas, reapply every 2 hours as needed.   Recommend staying in the shade or wearing long sleeves, sun glasses (UVA+UVB protection) and wide brim hats (4-inch brim around the entire circumference of the hat). Call for new or changing lesions.  Destruction of lesion - central upper forehead x2, L temple x3, R upper forehead x1, R temple x2, Scalp x7, L upper forehead x1 (16)  Destruction method: cryotherapy   Informed consent: discussed and consent obtained   Lesion destroyed using liquid nitrogen: Yes   Region frozen until ice ball extended beyond lesion: Yes   Outcome: patient tolerated procedure well with no complications   Post-procedure details: wound care instructions given   Additional details:  Prior to procedure, discussed risks of blister formation, small wound, skin dyspigmentation, or rare scar following cryotherapy. Recommend Vaseline ointment to treated areas while healing.    Skin cancer screening performed today.  Actinic Damage - Chronic condition, secondary to cumulative UV/sun exposure - diffuse scaly erythematous macules with underlying dyspigmentation - Recommend daily broad spectrum sunscreen SPF 30+ to sun-exposed areas, reapply every 2 hours as needed.  - Staying in the shade or wearing long sleeves, sun glasses (UVA+UVB protection) and wide brim hats (4-inch brim around the entire circumference of the hat) are also recommended for sun protection.  - Call for new or changing lesions.  Lentigines, Seborrheic Keratoses, Hemangiomas - Benign normal skin lesions - Benign-appearing - Call for any changes  Melanocytic Nevi - Tan-brown and/or pink-flesh-colored symmetric macules and papules - Benign appearing on exam today - Observation - Call clinic for new or changing moles -  Recommend daily use of broad spectrum spf 30+ sunscreen to sun-exposed areas.   SEBORRHEIC KERATOSIS - Stuck-on, waxy, tan-brown papules and/or plaques at left dorsal hand, left cheek - Benign-appearing - Discussed benign etiology  and prognosis. - Observe - Call for any changes  Callus  Exam: R medial great toe, skin thickening with hyperkeratosis.  Treatment:  Recommend using Mediplast pads.  Recommend podiatrist if worsening.  Can try AmLactin foot cream.   ROSACEA Exam Clear today   Chronic condition with duration or expected duration over one year. Currently well-controlled.   Rosacea is a chronic progressive skin condition usually affecting the face of adults, causing redness and/or acne bumps. It is treatable but not curable. It sometimes affects the eyes (ocular rosacea) as well. It may respond to topical and/or systemic medication and can flare with stress, sun exposure, alcohol, exercise, topical steroids (including hydrocortisone/cortisone 10) and some foods.  Daily application of broad spectrum spf 30+ sunscreen to face is recommended to reduce flares.  Patient denies grittiness of the eyes  Treatment Plan Continue Metronidazole 0.75% gel as directed.  Call for refills.   SEBORRHEIC DERMATITIS Exam: clear today  Chronic condition with duration or expected duration over one year. Currently well-controlled.   Seborrheic Dermatitis is a chronic persistent rash characterized by pinkness and scaling most commonly of the mid face but also can occur on the scalp (dandruff), ears; mid chest, mid back and groin.  It tends to be exacerbated by stress and cooler weather.  People who have neurologic disease may experience new onset or exacerbation of existing seborrheic dermatitis.  The condition is not curable but treatable and can be controlled.  Treatment Plan: Continue ketoconazole 2% shampoo Apply to scalp and let sit several minutes before rinsing.  Continue fluocinonide solutuion Apply to scalp qd/bid prn flares. Avoid face, groin, underarms.   Call for refills.     Return in about 1 year (around 01/03/2024) for TBSE, HxAKs.  I, Lawson Radar, CMA, am acting as scribe for Willeen Niece,  MD.   Documentation: I have reviewed the above documentation for accuracy and completeness, and I agree with the above.  Willeen Niece, MD

## 2023-01-03 NOTE — Patient Instructions (Addendum)
Cryotherapy Aftercare  Wash gently with soap and water everyday.   Apply Vaseline Jelly daily until healed.    Recommend using Curad Mediplast pads. Cut to fit wart or callus. Cover with Elastoplast waterproof tape or any waterproof band-aid. Change every 3 to 4 days, or sooner if necessary.  Treatment may require several months of regular use before results are seen.  Can try AmLactin foot cream.   Seborrheic Dermatitis Continue ketoconazole 2% shampoo Apply to scalp and let sit several minutes before rinsing.  Continue fluocinonide solutuion Apply to scalp qd/bid prn flares. Avoid face, groin, underarms.   Call for refills.   Rosacea: Continue Metronidazole 0.75% gel as directed.  Call for refills.   Seborrheic Keratosis  What causes seborrheic keratoses? Seborrheic keratoses are harmless, common skin growths that first appear during adult life.  As time goes by, more growths appear.  Some people may develop a large number of them.  Seborrheic keratoses appear on both covered and uncovered body parts.  They are not caused by sunlight.  The tendency to develop seborrheic keratoses can be inherited.  They vary in color from skin-colored to gray, brown, or even black.  They can be either smooth or have a rough, warty surface.   Seborrheic keratoses are superficial and look as if they were stuck on the skin.  Under the microscope this type of keratosis looks like layers upon layers of skin.  That is why at times the top layer may seem to fall off, but the rest of the growth remains and re-grows.    Treatment Seborrheic keratoses do not need to be treated, but can easily be removed in the office.  Seborrheic keratoses often cause symptoms when they rub on clothing or jewelry.  Lesions can be in the way of shaving.  If they become inflamed, they can cause itching, soreness, or burning.  Removal of a seborrheic keratosis can be accomplished by freezing, burning, or surgery. If any spot bleeds,  scabs, or grows rapidly, please return to have it checked, as these can be an indication of a skin cancer.    Recommend daily broad spectrum sunscreen SPF 30+ to sun-exposed areas, reapply every 2 hours as needed. Call for new or changing lesions.  Staying in the shade or wearing long sleeves, sun glasses (UVA+UVB protection) and wide brim hats (4-inch brim around the entire circumference of the hat) are also recommended for sun protection.     Melanoma ABCDEs  Melanoma is the most dangerous type of skin cancer, and is the leading cause of death from skin disease.  You are more likely to develop melanoma if you: Have light-colored skin, light-colored eyes, or red or blond hair Spend a lot of time in the sun Tan regularly, either outdoors or in a tanning bed Have had blistering sunburns, especially during childhood Have a close family member who has had a melanoma Have atypical moles or large birthmarks  Early detection of melanoma is key since treatment is typically straightforward and cure rates are extremely high if we catch it early.   The first sign of melanoma is often a change in a mole or a new dark spot.  The ABCDE system is a way of remembering the signs of melanoma.  A for asymmetry:  The two halves do not match. B for border:  The edges of the growth are irregular. C for color:  A mixture of colors are present instead of an even brown color. D for diameter:  Melanomas are usually (but not always) greater than 6mm - the size of a pencil eraser. E for evolution:  The spot keeps changing in size, shape, and color.  Please check your skin once per month between visits. You can use a small mirror in front and a large mirror behind you to keep an eye on the back side or your body.   If you see any new or changing lesions before your next follow-up, please call to schedule a visit.  Please continue daily skin protection including broad spectrum sunscreen SPF 30+ to sun-exposed  areas, reapplying every 2 hours as needed when you're outdoors.   Staying in the shade or wearing long sleeves, sun glasses (UVA+UVB protection) and wide brim hats (4-inch brim around the entire circumference of the hat) are also recommended for sun protection.       Due to recent changes in healthcare laws, you may see results of your pathology and/or laboratory studies on MyChart before the doctors have had a chance to review them. We understand that in some cases there may be results that are confusing or concerning to you. Please understand that not all results are received at the same time and often the doctors may need to interpret multiple results in order to provide you with the best plan of care or course of treatment. Therefore, we ask that you please give [place] 2 business days to thoroughly review all your results before contacting the office for clarification. Should we see a critical lab result, you will be contacted sooner.   If You Need Anything After Your Visit  If you have any questions or concerns for your doctor, please call our main line at [phone] and press option 4 to reach your doctor's medical assistant. If no one answers, please leave a voicemail as directed and we will return your call as soon as possible. Messages left after 4 pm will be answered the following business day.   You may also send [place] a message via MyChart. We typically respond to MyChart messages within 1-2 business days.  For prescription refills, please ask your pharmacy to contact our office. Our fax number is [phone].  If you have an urgent issue when the clinic is closed that cannot wait until the next business day, you can page your doctor at the number below.    Please note that while we do our best to be available for urgent issues outside of office hours, we are not available 24/7.   If you have an urgent issue and are unable to reach [place], you may choose to seek medical care at your  doctor's office, retail clinic, urgent care center, or emergency room.  If you have a medical emergency, please immediately call 911 or go to the emergency department.  Pager Numbers  - Dr. Gwen Pounds: [phone]  - Dr. Roseanne Reno: [phone]  - Dr. Katrinka Blazing: [phone]   In the event of inclement weather, please call our main line at [phone] for an update on the status of any delays or closures.  Dermatology Medication Tips: Please keep the boxes that topical medications come in in order to help keep track of the instructions about where and how to use these. Pharmacies typically print the medication instructions only on the boxes and not directly on the medication tubes.   If your medication is too expensive, please contact our office at [phone] option 4 or send [place] a message through MyChart.   We are unable to tell what  your co-pay for medications will be in advance as this is different depending on your insurance coverage. However, we may be able to find a substitute medication at lower cost or fill out paperwork to get insurance to cover a needed medication.   If a prior authorization is required to get your medication covered by your insurance company, please allow [place] 1-2 business days to complete this process.  Drug prices often vary depending on where the prescription is filled and some pharmacies may offer cheaper prices.  The website www.goodrx.com contains coupons for medications through different pharmacies. The prices here do not account for what the cost may be with help from insurance (it may be cheaper with your insurance), but the website can give you the price if you did not use any insurance.  - You can print the associated coupon and take it with your prescription to the pharmacy.  - You may also stop by our office during regular business hours and pick up a GoodRx coupon card.  - If you need your prescription sent electronically to a different pharmacy, notify  our office through [facility] or by phone at [phone] option 4.     Si Usted Necesita Algo Despus de Su Visita  Tambin puede enviarnos un mensaje a travs de Clinical cytogeneticist. Por lo general respondemos a los mensajes de MyChart en el transcurso de 1 a 2 das hbiles.  Para renovar recetas, por favor pida a su farmacia que se ponga en contacto con nuestra oficina. Annie Sable de fax es [place] [phone].  Si tiene un asunto urgente cuando la clnica est cerrada y que no puede esperar hasta el siguiente da hbil, puede llamar/localizar a su doctor(a) al nmero que aparece a continuacin.   Por favor, tenga en cuenta que aunque hacemos todo lo posible para estar disponibles para asuntos urgentes fuera del horario de [place], no estamos disponibles las 24 horas del da, los 7 [address]  [address] de la [place].   Si tiene un problema urgente y no puede comunicarse con nosotros, puede optar por buscar atencin mdica  en el consultorio de su doctor(a), en una clnica privada, en un centro de atencin urgente o en una sala de emergencias.  Si tiene Engineer, drilling, por favor llame inmediatamente al 911 o vaya a la sala de emergencias.  Nmeros de bper  - Dr. Gwen Pounds: [phone]  - Dra. Roseanne Reno: [phone]  - Dr. Katrinka Blazing: [phone]   En caso de inclemencias del tiempo, por favor llame a Lacy Duverney principal al [phone] para una actualizacin sobre el [place] de cualquier retraso o cierre.  Consejos para la medicacin en dermatologa: Por favor, guarde las cajas en las que vienen los medicamentos de uso tpico para ayudarle a seguir las instrucciones sobre dnde y cmo usarlos. Las farmacias generalmente imprimen las instrucciones del medicamento slo en las cajas y no directamente en los tubos del [place].   Si su medicamento es muy caro, por favor, pngase en contacto con Rolm Gala llamando al [phone] y presione la opcin 4 o envenos un mensaje a travs de  Clinical cytogeneticist.   No podemos decirle cul ser su copago por los medicamentos por adelantado ya que esto es diferente dependiendo de la cobertura de su seguro. Sin embargo, es posible que podamos encontrar un medicamento sustituto a Audiological scientist un formulario para que el seguro cubra el medicamento que se considera necesario.   Si se requiere una autorizacin previa para que su compaa de seguros [place] su  medicamento, por favor permtanos de 1 a 2 das hbiles para completar [address]  Los precios de los medicamentos varan con frecuencia dependiendo del Environmental consultant de dnde se surte la receta y alguna farmacias pueden ofrecer precios ms baratos.  El sitio web www.goodrx.com tiene cupones para medicamentos de Health and safety inspector. Los precios aqu no tienen en cuenta lo que podra costar con la ayuda del seguro (puede ser ms barato con su seguro), pero el sitio web puede darle el precio si no utiliz Tourist information centre manager.  - Puede imprimir el cupn correspondiente y llevarlo con su receta a la farmacia.  - Tambin puede pasar por nuestra oficina durante el horario de atencin regular y Education officer, museum una tarjeta de cupones de GoodRx.  - Si necesita que su receta se enve electrnicamente a una farmacia diferente, informe a nuestra oficina a travs de MyChart de [place] o por telfono llamando al [phone] y presione la opcin 4.

## 2024-01-08 ENCOUNTER — Encounter: Payer: BC Managed Care – PPO | Admitting: Dermatology
